# Patient Record
Sex: Male | Born: 1960
Health system: Southern US, Community
[De-identification: ages and names within clinical notes are randomized; demographics above are authoritative.]

## PROBLEM LIST (undated history)

## (undated) DIAGNOSIS — M1990 Unspecified osteoarthritis, unspecified site: Secondary | ICD-10-CM

## (undated) DIAGNOSIS — M797 Fibromyalgia: Secondary | ICD-10-CM

## (undated) HISTORY — PX: FINGER SURGERY: SHX640

## (undated) HISTORY — PX: EYE SURGERY: SHX253

## (undated) HISTORY — PX: TONSILLECTOMY: SUR1361

---

## 2004-03-02 ENCOUNTER — Encounter: Admission: RE | Admit: 2004-03-02 | Discharge: 2004-03-02 | Payer: Self-pay | Admitting: Family Medicine

## 2009-01-06 ENCOUNTER — Encounter: Admission: RE | Admit: 2009-01-06 | Discharge: 2009-01-06 | Payer: Self-pay | Admitting: Family Medicine

## 2009-05-01 ENCOUNTER — Emergency Department (HOSPITAL_COMMUNITY): Admission: EM | Admit: 2009-05-01 | Discharge: 2009-05-01 | Payer: Self-pay | Admitting: Emergency Medicine

## 2010-06-07 LAB — POCT CARDIAC MARKERS
CKMB, poc: <1 ng/mL — ABNORMAL LOW (ref 1.0–8.0)
Myoglobin, poc: 63.5 ng/mL (ref 12–200)
Troponin i, poc: <0.05 ng/mL (ref 0.00–0.09)

## 2010-06-07 LAB — POCT I-STAT, CHEM 8
BUN: 12 mg/dL (ref 6–23)
Calcium, Ion: 1.23 mmol/L (ref 1.12–1.32)
Chloride: 107 mEq/L (ref 96–112)
Creatinine, Ser: 1 mg/dL (ref 0.4–1.5)
Glucose, Bld: 89 mg/dL (ref 70–99)
HCT: 46 % (ref 39.0–52.0)
Hemoglobin: 15.6 g/dL (ref 13.0–17.0)
Potassium: 4.6 mEq/L (ref 3.5–5.1)
Sodium: 138 mEq/L (ref 135–145)
TCO2: 28 mmol/L (ref 0–100)

## 2010-06-07 LAB — URINALYSIS, ROUTINE W REFLEX MICROSCOPIC
Bilirubin Urine: NEGATIVE
Glucose, UA: NEGATIVE mg/dL
Hgb urine dipstick: NEGATIVE
Ketones, ur: NEGATIVE mg/dL
Nitrite: NEGATIVE
Protein, ur: NEGATIVE mg/dL
Specific Gravity, Urine: 1.013 (ref 1.005–1.030)
Urobilinogen, UA: 0.2 mg/dL (ref 0.0–1.0)
pH: 5 (ref 5.0–8.0)

## 2010-06-07 LAB — RAPID URINE DRUG SCREEN, HOSP PERFORMED
Amphetamines: NOT DETECTED
Barbiturates: NOT DETECTED
Benzodiazepines: NOT DETECTED
Cocaine: NOT DETECTED
Opiates: NOT DETECTED
Tetrahydrocannabinol: NOT DETECTED

## 2010-06-07 LAB — BASIC METABOLIC PANEL
BUN: 10 mg/dL (ref 6–23)
CO2: 27 mEq/L (ref 19–32)
Calcium: 10 mg/dL (ref 8.4–10.5)
Chloride: 103 mEq/L (ref 96–112)
Creatinine, Ser: 0.85 mg/dL (ref 0.4–1.5)
GFR calc Af Amer: >60 mL/min (ref >60)
GFR calc non Af Amer: >60 mL/min (ref >60)
Glucose, Bld: 91 mg/dL (ref 70–99)
Potassium: 4.8 mEq/L (ref 3.5–5.1)
Sodium: 137 mEq/L (ref 135–145)

## 2010-06-07 LAB — ETHANOL: Alcohol, Ethyl (B): <5 mg/dL (ref 0–10)

## 2014-07-21 ENCOUNTER — Emergency Department (HOSPITAL_COMMUNITY): Payer: 59

## 2014-07-21 ENCOUNTER — Emergency Department (HOSPITAL_COMMUNITY)
Admission: EM | Admit: 2014-07-21 | Discharge: 2014-07-21 | Disposition: A | Discharge status: Home / Self Care | Payer: 59 | Attending: Emergency Medicine | Admitting: Emergency Medicine

## 2014-07-21 ENCOUNTER — Encounter (HOSPITAL_COMMUNITY): Payer: Self-pay | Admitting: Emergency Medicine

## 2014-07-21 DIAGNOSIS — Z720 Tobacco use: Secondary | ICD-10-CM | POA: Insufficient documentation

## 2014-07-21 DIAGNOSIS — D219 Benign neoplasm of connective and other soft tissue, unspecified: Secondary | ICD-10-CM | POA: Diagnosis not present

## 2014-07-21 DIAGNOSIS — M25511 Pain in right shoulder: Secondary | ICD-10-CM | POA: Insufficient documentation

## 2014-07-21 DIAGNOSIS — M5412 Radiculopathy, cervical region: Secondary | ICD-10-CM | POA: Diagnosis not present

## 2014-07-21 DIAGNOSIS — M25519 Pain in unspecified shoulder: Secondary | ICD-10-CM

## 2014-07-21 DIAGNOSIS — D492 Neoplasm of unspecified behavior of bone, soft tissue, and skin: Secondary | ICD-10-CM

## 2014-07-21 LAB — BASIC METABOLIC PANEL
Anion gap: 9 (ref 5–15)
BUN: 12 mg/dL (ref 6–20)
CALCIUM: 8.7 mg/dL — AB (ref 8.9–10.3)
CO2: 23 mmol/L (ref 22–32)
Chloride: 108 mmol/L (ref 101–111)
Creatinine, Ser: 0.89 mg/dL (ref 0.61–1.24)
GFR calc Af Amer: >60 mL/min (ref >60)
GFR calc non Af Amer: >60 mL/min (ref >60)
Glucose, Bld: 101 mg/dL — ABNORMAL HIGH (ref 70–99)
Potassium: 4.2 mmol/L (ref 3.5–5.1)
SODIUM: 140 mmol/L (ref 135–145)

## 2014-07-21 LAB — CBC WITH DIFFERENTIAL/PLATELET
BASOS PCT: 0 % (ref 0–1)
Basophils Absolute: 0 10*3/uL (ref 0.0–0.1)
EOS ABS: 0.2 10*3/uL (ref 0.0–0.7)
Eosinophils Relative: 2 % (ref 0–5)
HCT: 42.8 % (ref 39.0–52.0)
Hemoglobin: 14.7 g/dL (ref 13.0–17.0)
Lymphocytes Relative: 17 % (ref 12–46)
Lymphs Abs: 1.6 10*3/uL (ref 0.7–4.0)
MCH: 31.2 pg (ref 26.0–34.0)
MCHC: 34.3 g/dL (ref 30.0–36.0)
MCV: 90.9 fL (ref 78.0–100.0)
Monocytes Absolute: 1 10*3/uL (ref 0.1–1.0)
Monocytes Relative: 11 % (ref 3–12)
Neutro Abs: 6.6 10*3/uL (ref 1.7–7.7)
Neutrophils Relative %: 70 % (ref 43–77)
PLATELETS: 234 10*3/uL (ref 150–400)
RBC: 4.71 MIL/uL (ref 4.22–5.81)
RDW: 13.1 % (ref 11.5–15.5)
WBC: 9.3 10*3/uL (ref 4.0–10.5)

## 2014-07-21 LAB — I-STAT TROPONIN, ED: Troponin i, poc: 0.01 ng/mL (ref 0.00–0.08)

## 2014-07-21 NOTE — ED Notes (Signed)
Patient with left shoulder pain.  Patient states that he woke up with a knot on left shoulder.  He states that it was red, but now with increased swelling in the lump.  Now with pain to touch at the left shoulder.  Patient is also having numbness and tingling in bilateral hands and feet that started last night.

## 2014-07-21 NOTE — Discharge Instructions (Signed)
Lipoma A lipoma is a noncancerous (benign) tumor composed of fat cells. They are usually found under the skin (subcutaneous). A lipoma may occur in any tissue of the body that contains fat. Common areas for lipomas to appear include the back, shoulders, buttocks, and thighs. Lipomas are a very common soft tissue growth. They are soft and grow slowly. Most problems caused by a lipoma depend on where it is growing. DIAGNOSIS  A lipoma can be diagnosed with a physical exam. These tumors rarely become cancerous, but radiographic studies can help determine this for certain. Studies used may include:  Computerized X-ray scans (CT or CAT scan).  Computerized magnetic scans (MRI). TREATMENT  Small lipomas that are not causing problems may be watched. If a lipoma continues to enlarge or causes problems, removal is often the best treatment. Lipomas can also be removed to improve appearance. Surgery is done to remove the fatty cells and the surrounding capsule. Most often, this is done with medicine that numbs the area (local anesthetic). The removed tissue is examined under a microscope to make sure it is not cancerous. Keep all follow-up appointments with your caregiver. SEEK MEDICAL CARE IF:   The lipoma becomes larger or hard.  The lipoma becomes painful, red, or increasingly swollen. These could be signs of infection or a more serious condition. Document Released: 02/22/2002 Document Revised: 05/27/2011 Document Reviewed: 08/04/2009 [facility] Patient Information 2015 [place], [place]. This information is not intended to replace advice given to you by your health care provider. Make sure you discuss any questions you have with your health care provider.  Cervical Radiculopathy Cervical radiculopathy happens when a nerve in the neck is pinched or bruised by a slipped (herniated) disk or by arthritic changes in the bones of the cervical spine. This can occur due to an injury or as part of the normal aging  process. Pressure on the cervical nerves can cause pain or numbness that runs from your neck all the way down into your arm and fingers. CAUSES  There are many possible causes, including:  Injury.  Muscle tightness in the neck from overuse.  Swollen, painful joints (arthritis).  Breakdown or degeneration in the bones and joints of the spine (spondylosis) due to aging.  Bone spurs that may develop near the cervical nerves. SYMPTOMS  Symptoms include pain, weakness, or numbness in the affected arm and hand. Pain can be severe or irritating. Symptoms may be worse when extending or turning the neck. DIAGNOSIS  Your caregiver will ask about your symptoms and do a physical exam. He or she may test your strength and reflexes. X-rays, CT scans, and MRI scans may be needed in cases of injury or if the symptoms do not go away after a period of time. Electromyography (EMG) or nerve conduction testing may be done to study how your nerves and muscles are working. TREATMENT  Your caregiver may recommend certain exercises to help relieve your symptoms. Cervical radiculopathy can, and often does, get better with time and treatment. If your problems continue, treatment options may include:  Wearing a soft collar for short periods of time.  Physical therapy to strengthen the neck muscles.  Medicines, such as nonsteroidal anti-inflammatory drugs (NSAIDs), oral corticosteroids, or spinal injections.  Surgery. Different types of surgery may be done depending on the cause of your problems. HOME CARE INSTRUCTIONS   Put ice on the affected area.  Put ice in a plastic bag.  Place a towel between your skin and the bag.  Leave  the ice on for 15-20 minutes, 03-04 times a day or as directed by your caregiver.  If ice does not help, you can try using heat. Take a warm shower or bath, or use a hot water bottle as directed by your caregiver.  You may try a gentle neck and shoulder massage.  Use a flat  pillow when you sleep.  Only take over-the-counter or prescription medicines for pain, discomfort, or fever as directed by your caregiver.  If physical therapy was prescribed, follow your caregiver's directions.  If a soft collar was prescribed, use it as directed. SEEK IMMEDIATE MEDICAL CARE IF:   Your pain gets much worse and cannot be controlled with medicines.  You have weakness or numbness in your hand, arm, face, or leg.  You have a high fever or a stiff, rigid neck.  You lose bowel or bladder control (incontinence).  You have trouble with walking, balance, or speaking. MAKE SURE YOU:   Understand these instructions.  Will watch your condition.  Will get help right away if you are not doing well or get worse. Document Released: 11/27/2000 Document Revised: 05/27/2011 Document Reviewed: 10/16/2010 [facility] Patient Information 2015 [place], [place]. This information is not intended to replace advice given to you by your health care provider. Make sure you discuss any questions you have with your health care provider.

## 2014-07-21 NOTE — ED Provider Notes (Signed)
CSN: [id number]     Arrival date & time 07/21/14  2130 History   First MD Initiated Contact with Patient 07/21/14 [phone]     Chief Complaint  Patient presents with  . Shoulder Pain     (Consider location/radiation/quality/duration/timing/severity/associated sxs/prior Treatment) HPI Dylan Figueroa is a 54 year old male with no significant past medical history who presents the ER with complaint of a knot on the shoulder, and an episode of lightheadedness on his way to work. Patient states morning he woke up and noticed pain in his right shoulder. He noticed a "knot" on the superior aspect of his acromial region. Patient states while he was driving to work he had an episode of she describes headedness where he began to have tingling sensation bilaterally in his hands and his. Patient states by the time he came to the ER the lightheadedness, and tingling sensation had subsided. Patient denies shoulder injury, swelling, warmth. Patient denies headache, blurred vision, dizziness, weakness, chest pain, shortness of breath, palpitations, nausea, vomiting, abdominal pain, fever.  History reviewed. No pertinent past medical history. Past Surgical History  Procedure Laterality Date  . Finger surgery     History reviewed. No pertinent family history. History  Substance Use Topics  . Smoking status: Current Every Day Smoker  . Smokeless tobacco: Not on file  . Alcohol Use: 4.2 oz/week    7 Cans of beer per week     Comment: 1 can per day    Review of Systems  Constitutional: Negative for fever.  HENT: Negative for trouble swallowing.   Eyes: Negative for visual disturbance.  Respiratory: Negative for shortness of breath.   Cardiovascular: Negative for chest pain.  Gastrointestinal: Negative for nausea, vomiting and abdominal pain.  Genitourinary: Negative for dysuria.  Musculoskeletal: Negative for neck pain.       Mass on shoulder  Skin: Negative for rash.  Neurological: Negative for dizziness,  weakness and numbness.  Psychiatric/Behavioral: Negative.       Allergies  Review of patient's allergies indicates no known allergies.  Home Medications   Prior to Admission medications   Not on File   BP 111/64 mmHg  Pulse 68  Temp(Src) 98 F (36.7 C) (Oral)  Resp 23  Ht 5\' 8"  (1.727 m)  Wt 170 lb (77.111 kg)  BMI 25.85 kg/m2  SpO2 96% Physical Exam  Constitutional: He is oriented to person, place, and time. He appears well-developed and well-nourished. No distress.  HENT:  Head: Normocephalic and atraumatic.  Mouth/Throat: Oropharynx is clear and moist. No oropharyngeal exudate.  Eyes: Right eye exhibits no discharge. Left eye exhibits no discharge. No scleral icterus.  Neck: Normal range of motion.  Cardiovascular: Normal rate, regular rhythm and normal heart sounds.   No murmur heard. Pulmonary/Chest: Effort normal and breath sounds normal. No respiratory distress.  Abdominal: Soft. There is no tenderness.  Musculoskeletal: Normal range of motion. He exhibits no edema or tenderness.  Neurological: He is alert and oriented to person, place, and time. No cranial nerve deficit. Coordination normal.  Skin: Skin is warm and dry. No rash noted. He is not diaphoretic.  Psychiatric: He has a normal mood and affect.  Nursing note and vitals reviewed.   ED Course  Procedures (including critical care time) Labs Review Labs Reviewed  BASIC METABOLIC PANEL - Abnormal; Notable for the following:    Glucose, Bld 101 (*)    Calcium 8.7 (*)    All other components within normal limits  CBC WITH DIFFERENTIAL/PLATELET  I-STAT  TROPOININ, ED    Imaging Review Dg Shoulder Left  07/21/2014   CLINICAL DATA:  54 year old male with left shoulder pain for several weeks with no known injury. Painful palpable abnormality along the superior aspect of the shoulder discovered this morning. Initial encounter.  EXAM: LEFT SHOULDER - 2+ VIEW  COMPARISON:  Chest radiographs from [facility] 03/20/2012  FINDINGS: No glenohumeral joint dislocation. Proximal left humerus intact. Small calcified density near the rotator cuff insertion likely is degenerative. No left clavicle or scapula fracture identified. Visible left ribs and lung parenchyma within normal limits. No discrete soft tissue lesion is evident.  IMPRESSION: No acute osseous abnormality identified about the left shoulder.   Electronically Signed   By: Genevie Ann M.D.   On: 07/21/2014 08:11     EKG Interpretation   Date/Time:  Thursday Jul 21 2014 08:11:39 EDT Ventricular Rate:  57 PR Interval:  150 QRS Duration: 88 QT Interval:  400 QTC Calculation: 389 R Axis:   37 Text Interpretation:  Sinus bradycardia Otherwise normal ECG No  significant change since last tracing Confirmed by Debby Freiberg [phone])  on 07/21/2014 9:40:05 AM      MDM   Final diagnoses:  Shoulder pain  Soft tissue tumor  Cervical radiculopathy    Patient here with several complaints, patient was initially concerned about an episode of lightheadedness and bilateral extremity tingling sensation. Patient states he is having the sensation of tingling in his arms and a dermatome pattern with over the past several weeks which he states has been mostly associated with movement of his arms and specific directions. Patient does not have neck pain and has full range of motion of neck. No neck injuries. Episode of lightheadedness today seems to be benign. There is no concern for ACS. Wells criteria for PE 0. There is no concern for CVA. Neuro exam is benign, and patient has normal strength in all extremities. Patient has been asymptomatic throughout ER stay here. With tingling sensation being completely positional, likely patient may be having some mild cervical radiculopathy. Mass on patient's shoulder most consistent with a lipoma or epidermoid cyst. Ultrasound was placed, does not show any pockets of fluid collection. There is no concern for  infection of this. Patient afebrile, hemodynamically stable, well-appearing and in no acute distress throughout stay here. He is also asymptomatic throughout his stay here. Patient will be discharged and strongly encouraged to follow-up with his primary care provider. Discussed return precautions with patient, patient verbalizes understanding and agreement of this plan. Encouraged patient to call or return to ER if any worsening symptoms or should he have any questions or concerns.  BP 111/64 mmHg  Pulse 68  Temp(Src) 98 F (36.7 C) (Oral)  Resp 23  Ht 5\' 8"  (1.727 m)  Wt 170 lb (77.111 kg)  BMI 25.85 kg/m2  SpO2 96%  Signed,  Dahlia Bailiff, PA-C 5:51 PM   Dahlia Bailiff, PA-C 07/21/14 1752  Debby Freiberg, MD 07/24/14 [phone]

## 2014-09-30 ENCOUNTER — Ambulatory Visit: Payer: Self-pay | Admitting: Surgery

## 2014-09-30 NOTE — H&P (Signed)
History of Present Illness Dylan Figueroa. Tiron Suski MD; 09/30/2014 2:24 PM) Patient words: lipoma on upper back.  The patient is a 54 year old male who presents with a complaint of Mass. Referred by Dr. Gaynelle Figueroa. This is a 54 year old male who presents with recent onset of a palpable mass on his left shoulder. The patient states that he woke up one morning and noticed a tender mass on the upper left shoulder. He presented to the emergency department then they felt that this represented a lipoma. It is a bit unusual because the patient states that he has never seen this before and it is fairly obvious. Initially this area was fairly erythematous and tender. The erythema has faded but is still remains mildly tender. He has not had any imaging of this area. Other Problems Dylan Figueroa, CMA; 09/30/2014 9:48 AM) No pertinent past medical history  Past Surgical History Dylan Figueroa, CMA; 09/30/2014 9:48 AM) No pertinent past surgical history  Diagnostic Studies History Dylan Figueroa, CMA; 09/30/2014 9:48 AM) Colonoscopy 1-5 years ago  Allergies Dylan Figueroa, CMA; 09/30/2014 9:48 AM) No Known Drug Allergies 09/30/2014  Medication History Dylan Figueroa, CMA; 09/30/2014 9:49 AM) Lovastatin (40MG  Tablet, Oral) Active. Venlafaxine HCl (75MG  Capsule ER, Oral) Active. TraMADol HCl (50MG  Tablet, Oral) Active. Medications Reconciled  Social History Dylan Figueroa, [place]; 09/30/2014 9:48 AM) Alcohol use Moderate alcohol use. Caffeine use Coffee. No drug use Tobacco use Current every day smoker.  Family History Dylan Figueroa, [place]; 09/30/2014 9:48 AM) Breast Cancer Mother. Cancer Father.     Review of Systems Dylan Figueroa CMA; 09/30/2014 9:48 AM) General Present- Fatigue and Night Sweats. Not Present- Appetite Loss, Chills, Fever, Weight Gain and Weight Loss. Skin Not Present- Change in Wart/Mole, Dryness, Hives, Jaundice, New Lesions, Non-Healing Wounds, Rash and Ulcer. HEENT Present-  Hearing Loss and Wears glasses/contact lenses. Not Present- Earache, Hoarseness, Nose Bleed, Oral Ulcers, Ringing in the Ears, Seasonal Allergies, Sinus Pain, Sore Throat, Visual Disturbances and Yellow Eyes. Breast Not Present- Breast Mass, Breast Pain, Nipple Discharge and Skin Changes. Cardiovascular Not Present- Chest Pain, Difficulty Breathing Lying Down, Leg Cramps, Palpitations, Rapid Heart Rate, Shortness of Breath and Swelling of Extremities. Gastrointestinal Not Present- Abdominal Pain, Bloating, Bloody Stool, Change in Bowel Habits, Chronic diarrhea, Constipation, Difficulty Swallowing, Excessive gas, Gets full quickly at meals, Hemorrhoids, Indigestion, Nausea, Rectal Pain and Vomiting. Male Genitourinary Not Present- Blood in Urine, Change in Urinary Stream, Frequency, Impotence, Nocturia, Painful Urination, Urgency and Urine Leakage. Musculoskeletal Present- Joint Pain. Not Present- Back Pain, Joint Stiffness, Muscle Pain, Muscle Weakness and Swelling of Extremities. Neurological Not Present- Decreased Memory, Fainting, Headaches, Numbness, Seizures, Tingling, Tremor, Trouble walking and Weakness. Psychiatric Not Present- Anxiety, Bipolar, Change in Sleep Pattern, Depression, Fearful and Frequent crying. Endocrine Not Present- Cold Intolerance, Excessive Hunger, Hair Changes, Heat Intolerance, Hot flashes and New Diabetes. Hematology Not Present- Easy Bruising, Excessive bleeding, Gland problems, HIV and Persistent Infections.  Vitals Dylan Figueroa CMA; 09/30/2014 9:49 AM) 09/30/2014 9:49 AM Weight: 171 lb Height: 68in Body Surface Area: 1.93 m Body Mass Index: 26 kg/m Temp.: 98.39F(Oral)  Pulse: 89 (Regular)  BP: 130/72 (Sitting, Left Arm, Standard)     Physical Exam [name] K. Malaka Ruffner MD; 09/30/2014 2:25 PM)  The physical exam findings are as follows: Note:WDWN in NAD HEENT: EOMI, sclera anicteric Neck: No masses, no thyromegaly Left shoulder - 3 cm subcutaneous  mass; soft; well-demarcated; no overlying erythema or induration Lungs: CTA bilaterally; normal respiratory effort CV: Regular rate and rhythm; no murmurs  Abd: +bowel sounds, soft, non-tender, no masses Ext: Well-perfused; no edema Skin: Warm, dry; no sign of jaundice    Assessment & Plan [name] K. Tahjir Silveria MD; 09/30/2014 10:58 AM)  MASS OF SKIN OF LEFT SHOULDER (782.2  R22.32)  Current Plans Schedule for Surgery - Excision of left shoulder mass - 3 cm. The surgical procedure has been discussed with the patient. Potential risks, benefits, alternative treatments, and expected outcomes have been explained. All of the patient's questions at this time have been answered. The likelihood of reaching the patient's treatment goal is good. The patient understand the proposed surgical procedure and wishes to proceed.  Dylan Figueroa. [name], MD, [facility]  General/ Trauma Surgery  09/30/2014 2:25 PM

## 2014-10-11 NOTE — Patient Instructions (Addendum)
Dylan Figueroa  10/11/2014   Your procedure is scheduled on: Thursday 10/13/2014  Report to [facility] Main  Entrance take [place]  elevators to 3rd floor to  [place] at  [place]  AM.  Call this number if you have problems the morning of surgery [phone]   Remember: ONLY 1 PERSON MAY GO WITH YOU TO SHORT STAY TO GET  READY MORNING OF [name].  Do not eat food or drink liquids :After Midnight.     Take these medicines the morning of surgery with A SIP OF WATER: none                               You may not have any metal on your body including hair pins and              piercings  Do not wear jewelry, make-up, lotions, powders or perfumes, deodorant             Do not wear nail polish.  Do not shave  48 hours prior to surgery.              Men may shave face and neck.   Do not bring valuables to the hospital. [place].  Contacts, dentures or bridgework may not be worn into surgery.  Leave suitcase in the car. After surgery it may be brought to your room.     Patients discharged the day of surgery will not be allowed to drive home.  Name and phone number of your driver:  Special Instructions: N/A              Please read over the following fact sheets you were given: _____________________________________________________________________             [facility] - Preparing for Surgery Before surgery, you can play an important role.  Because skin is not sterile, your skin needs to be as free of germs as possible.  You can reduce the number of germs on your skin by washing with CHG (chlorahexidine gluconate) soap before surgery.  CHG is an antiseptic cleaner which kills germs and bonds with the skin to continue killing germs even after washing. Please DO NOT use if you have an allergy to CHG or antibacterial soaps.  If your skin becomes reddened/irritated stop using the CHG and inform your nurse when  you arrive at Short Stay. Do not shave (including legs and underarms) for at least 48 hours prior to the first CHG shower.  You may shave your face/neck. Please follow these instructions carefully:  1.  Shower with CHG Soap the night before surgery and the  morning of Surgery.  2.  If you choose to wash your hair, wash your hair first as usual with your  normal  shampoo.  3.  After you shampoo, rinse your hair and body thoroughly to remove the  shampoo.                           4.  Use CHG as you would any other liquid soap.  You can apply chg directly  to the skin and wash  Gently with a scrungie or clean washcloth.  5.  Apply the CHG Soap to your body ONLY FROM THE NECK DOWN.   Do not use on face/ open                           Wound or open sores. Avoid contact with eyes, ears mouth and genitals (private parts).                       Wash face,  Genitals (private parts) with your normal soap.             6.  Wash thoroughly, paying special attention to the area where your surgery  will be performed.  7.  Thoroughly rinse your body with warm water from the neck down.  8.  DO NOT shower/wash with your normal soap after using and rinsing off  the CHG Soap.                9.  Pat yourself dry with a clean towel.            10.  Wear clean pajamas.            11.  Place clean sheets on your bed the night of your first shower and do not  sleep with pets. Day of Surgery : Do not apply any lotions/deodorants the morning of surgery.  Please wear clean clothes to the hospital/surgery center.  FAILURE TO FOLLOW THESE INSTRUCTIONS MAY RESULT IN THE CANCELLATION OF YOUR SURGERY PATIENT SIGNATURE_________________________________  NURSE SIGNATURE__________________________________  ________________________________________________________________________   Adam [place]ix  An incentive spirometer is a tool that can help keep your lungs clear and active. This tool measures  how well you are filling your lungs with each breath. Taking long deep breaths may help reverse or decrease the chance of developing breathing (pulmonary) problems (especially infection) following:  A long period of time when you are unable to move or be active. BEFORE THE PROCEDURE   If the spirometer includes an indicator to show your best effort, your nurse or respiratory therapist will set it to a desired goal.  If possible, sit up straight or lean slightly forward. Try not to slouch.  Hold the incentive spirometer in an upright position. INSTRUCTIONS FOR USE  1. Sit on the edge of your bed if possible, or sit up as far as you can in bed or on a chair. 2. Hold the incentive spirometer in an upright position. 3. Breathe out normally. 4. Place the mouthpiece in your mouth and seal your lips tightly around it. 5. Breathe in slowly and as deeply as possible, raising the piston or the ball toward the top of the column. 6. Hold your breath for 3-5 seconds or for as long as possible. Allow the piston or ball to fall to the bottom of the column. 7. Remove the mouthpiece from your mouth and breathe out normally. 8. Rest for a few seconds and repeat Steps 1 through 7 at least 10 times every 1-2 hours when you are awake. Take your time and take a few normal breaths between deep breaths. 9. The spirometer may include an indicator to show your best effort. Use the indicator as a goal to work toward during each repetition. 10. After each set of 10 deep breaths, practice coughing to be sure your lungs are clear. If you have an incision (the cut made at the time of surgery),  support your incision when coughing by placing a pillow or rolled up towels firmly against it. Once you are able to get out of bed, walk around indoors and cough well. You may stop using the incentive spirometer when instructed by your caregiver.  RISKS AND COMPLICATIONS  Take your time so you do not get dizzy or light-headed.  If  you are in pain, you may need to take or ask for pain medication before doing incentive spirometry. It is harder to take a deep breath if you are having pain. AFTER USE  Rest and breathe slowly and easily.  It can be helpful to keep track of a log of your progress. Your caregiver can provide you with a simple table to help with this. If you are using the spirometer at home, follow these instructions: [place] IF:   You are having difficultly using the spirometer.  You have trouble using the spirometer as often as instructed.  Your pain medication is not giving enough relief while using the spirometer.  You develop fever of 100.5 F (38.1 C) or higher. SEEK IMMEDIATE MEDICAL CARE IF:   You cough up bloody sputum that had not been present before.  You develop fever of 102 F (38.9 C) or greater.  You develop worsening pain at or near the incision site. MAKE SURE YOU:   Understand these instructions.  Will watch your condition.  Will get help right away if you are not doing well or get worse. Document Released: 07/15/2006 Document Revised: 05/27/2011 Document Reviewed: 09/15/2006 [facility] Patient Information 2014 [place], [place].   ________________________________________________________________________

## 2014-10-12 ENCOUNTER — Encounter (HOSPITAL_COMMUNITY): Payer: Self-pay

## 2014-10-12 ENCOUNTER — Encounter (HOSPITAL_COMMUNITY)
Admission: RE | Admit: 2014-10-12 | Discharge: 2014-10-12 | Disposition: A | Discharge status: Home / Self Care | Payer: 59 | Source: Ambulatory Visit | Attending: Surgery | Admitting: Surgery

## 2014-10-12 DIAGNOSIS — F17200 Nicotine dependence, unspecified, uncomplicated: Secondary | ICD-10-CM | POA: Diagnosis not present

## 2014-10-12 DIAGNOSIS — Z79899 Other long term (current) drug therapy: Secondary | ICD-10-CM | POA: Diagnosis not present

## 2014-10-12 DIAGNOSIS — R2232 Localized swelling, mass and lump, left upper limb: Secondary | ICD-10-CM | POA: Diagnosis present

## 2014-10-12 DIAGNOSIS — E65 Localized adiposity: Secondary | ICD-10-CM | POA: Diagnosis not present

## 2014-10-12 HISTORY — DX: Unspecified osteoarthritis, unspecified site: M19.90

## 2014-10-12 HISTORY — DX: Fibromyalgia: M79.7

## 2014-10-12 LAB — CBC
HCT: 41.8 % (ref 39.0–52.0)
Hemoglobin: 14.1 g/dL (ref 13.0–17.0)
MCH: 30.9 pg (ref 26.0–34.0)
MCHC: 33.7 g/dL (ref 30.0–36.0)
MCV: 91.7 fL (ref 78.0–100.0)
Platelets: 256 10*3/uL (ref 150–400)
RBC: 4.56 MIL/uL (ref 4.22–5.81)
RDW: 13.1 % (ref 11.5–15.5)
WBC: 13.4 10*3/uL — ABNORMAL HIGH (ref 4.0–10.5)

## 2014-10-13 ENCOUNTER — Encounter (HOSPITAL_COMMUNITY)
Admission: RE | Disposition: A | Discharge status: Home / Self Care | Payer: Self-pay | Source: Ambulatory Visit | Attending: Surgery

## 2014-10-13 ENCOUNTER — Encounter (HOSPITAL_COMMUNITY): Payer: Self-pay | Admitting: *Deleted

## 2014-10-13 ENCOUNTER — Ambulatory Visit (HOSPITAL_COMMUNITY): Payer: 59 | Admitting: Anesthesiology

## 2014-10-13 ENCOUNTER — Ambulatory Visit (HOSPITAL_COMMUNITY)
Admission: RE | Admit: 2014-10-13 | Discharge: 2014-10-13 | Disposition: A | Discharge status: Home / Self Care | Payer: 59 | Source: Ambulatory Visit | Attending: Surgery | Admitting: Surgery

## 2014-10-13 DIAGNOSIS — E65 Localized adiposity: Secondary | ICD-10-CM | POA: Insufficient documentation

## 2014-10-13 DIAGNOSIS — F17200 Nicotine dependence, unspecified, uncomplicated: Secondary | ICD-10-CM | POA: Insufficient documentation

## 2014-10-13 DIAGNOSIS — Z79899 Other long term (current) drug therapy: Secondary | ICD-10-CM | POA: Insufficient documentation

## 2014-10-13 HISTORY — PX: MASS EXCISION: SHX2000

## 2014-10-13 SURGERY — EXCISION MASS
Anesthesia: General | Laterality: Left

## 2014-10-13 MED ORDER — 0.9 % SODIUM CHLORIDE (POUR BTL) OPTIME
TOPICAL | Status: DC | PRN
  Administered 2014-10-13: 1000 mL

## 2014-10-13 MED ORDER — MEPERIDINE HCL 50 MG/ML IJ SOLN: 6.2500 mg | INTRAMUSCULAR | Status: DC | PRN

## 2014-10-13 MED ORDER — FENTANYL CITRATE (PF) 100 MCG/2ML IJ SOLN
INTRAMUSCULAR | Status: AC
  Filled 2014-10-13: qty 2

## 2014-10-13 MED ORDER — DEXAMETHASONE SODIUM PHOSPHATE 10 MG/ML IJ SOLN
INTRAMUSCULAR | Status: AC
  Filled 2014-10-13: qty 1

## 2014-10-13 MED ORDER — BUPIVACAINE-EPINEPHRINE (PF) 0.25% -1:200000 IJ SOLN
INTRAMUSCULAR | Status: AC
  Filled 2014-10-13: qty 30

## 2014-10-13 MED ORDER — LACTATED RINGERS IV SOLN
INTRAVENOUS | Status: DC | PRN
  Administered 2014-10-13: 07:00:00 via INTRAVENOUS

## 2014-10-13 MED ORDER — ONDANSETRON HCL 4 MG/2ML IJ SOLN: 4.0000 mg | INTRAMUSCULAR | Status: DC | PRN

## 2014-10-13 MED ORDER — PROPOFOL 10 MG/ML IV BOLUS
INTRAVENOUS | Status: AC
  Filled 2014-10-13: qty 20

## 2014-10-13 MED ORDER — LACTATED RINGERS IV SOLN: INTRAVENOUS | Status: DC

## 2014-10-13 MED ORDER — BUPIVACAINE HCL (PF) 0.25 % IJ SOLN
INTRAMUSCULAR | Status: DC | PRN
  Administered 2014-10-13: 15 mL

## 2014-10-13 MED ORDER — MORPHINE SULFATE 10 MG/ML IJ SOLN: 2.0000 mg | INTRAMUSCULAR | Status: DC | PRN

## 2014-10-13 MED ORDER — CEFAZOLIN SODIUM-DEXTROSE 2-3 GM-% IV SOLR
INTRAVENOUS | Status: AC
  Filled 2014-10-13: qty 50

## 2014-10-13 MED ORDER — HYDROCODONE-ACETAMINOPHEN 5-325 MG PO TABS
1.0000 | ORAL_TABLET | ORAL | Status: DC | PRN
  Administered 2014-10-13: 1 via ORAL
  Filled 2014-10-13: qty 1

## 2014-10-13 MED ORDER — FENTANYL CITRATE (PF) 250 MCG/5ML IJ SOLN
INTRAMUSCULAR | Status: DC | PRN
  Administered 2014-10-13 (×5): 25 ug via INTRAVENOUS

## 2014-10-13 MED ORDER — HYDROCODONE-ACETAMINOPHEN 5-325 MG PO TABS: 1.0000 | ORAL_TABLET | ORAL | Status: AC | PRN

## 2014-10-13 MED ORDER — PROPOFOL 10 MG/ML IV BOLUS
INTRAVENOUS | Status: DC | PRN
  Administered 2014-10-13: 200 mg via INTRAVENOUS

## 2014-10-13 MED ORDER — MIDAZOLAM HCL 5 MG/5ML IJ SOLN
INTRAMUSCULAR | Status: DC | PRN
  Administered 2014-10-13: 2 mg via INTRAVENOUS

## 2014-10-13 MED ORDER — ONDANSETRON HCL 4 MG/2ML IJ SOLN: 4.0000 mg | Freq: Once | INTRAMUSCULAR | Status: DC | PRN

## 2014-10-13 MED ORDER — ONDANSETRON HCL 4 MG/2ML IJ SOLN
INTRAMUSCULAR | Status: DC | PRN
  Administered 2014-10-13: 4 mg via INTRAVENOUS

## 2014-10-13 MED ORDER — MIDAZOLAM HCL 2 MG/2ML IJ SOLN
INTRAMUSCULAR | Status: AC
  Filled 2014-10-13: qty 2

## 2014-10-13 MED ORDER — FENTANYL CITRATE (PF) 100 MCG/2ML IJ SOLN
INTRAMUSCULAR | Status: AC
Start: 2014-10-13 — End: 2014-10-13
  Filled 2014-10-13: qty 2

## 2014-10-13 MED ORDER — CEFAZOLIN SODIUM-DEXTROSE 2-3 GM-% IV SOLR
2.0000 g | INTRAVENOUS | Status: AC
  Administered 2014-10-13: 2 g via INTRAVENOUS

## 2014-10-13 MED ORDER — CHLORHEXIDINE GLUCONATE 4 % EX LIQD: 1.0000 "application " | Freq: Once | CUTANEOUS | Status: DC

## 2014-10-13 MED ORDER — ONDANSETRON HCL 4 MG/2ML IJ SOLN
INTRAMUSCULAR | Status: AC
  Filled 2014-10-13: qty 2

## 2014-10-13 MED ORDER — DEXAMETHASONE SODIUM PHOSPHATE 10 MG/ML IJ SOLN
INTRAMUSCULAR | Status: DC | PRN
  Administered 2014-10-13: 10 mg via INTRAVENOUS

## 2014-10-13 MED ORDER — HYDROMORPHONE HCL 1 MG/ML IJ SOLN: 0.2500 mg | INTRAMUSCULAR | Status: DC | PRN

## 2014-10-13 SURGICAL SUPPLY — 38 items
APL SKNCLS STERI-STRIP NONHPOA (GAUZE/BANDAGES/DRESSINGS) ×1
BENZOIN TINCTURE PRP APPL 2/3 (GAUZE/BANDAGES/DRESSINGS) ×3 IMPLANT
BLADE HEX COATED 2.75 (ELECTRODE) ×3 IMPLANT
BLADE SURG 15 STRL LF DISP TIS (BLADE) ×1 IMPLANT
BLADE SURG 15 STRL SS (BLADE) ×3
CANISTER SUCTION 1200CC (MISCELLANEOUS) IMPLANT
CHLORAPREP W/TINT 26ML (MISCELLANEOUS) ×3 IMPLANT
CLOSURE WOUND 1/2 X4 (GAUZE/BANDAGES/DRESSINGS) ×1
COVER SURGICAL LIGHT HANDLE (MISCELLANEOUS) IMPLANT
DECANTER SPIKE VIAL GLASS SM (MISCELLANEOUS) IMPLANT
DRAPE UTILITY XL STRL (DRAPES) ×3 IMPLANT
DRSG TEGADERM 4X4.75 (GAUZE/BANDAGES/DRESSINGS) ×3 IMPLANT
ELECT COATED BLADE 2.86 ST (ELECTRODE) IMPLANT
ELECT REM PT RETURN 9FT ADLT (ELECTROSURGICAL) ×3
ELECTRODE REM PT RTRN 9FT ADLT (ELECTROSURGICAL) ×1 IMPLANT
GAUZE PACKING IODOFORM 1/4X15 (GAUZE/BANDAGES/DRESSINGS) IMPLANT
GAUZE SPONGE 2X2 8PLY STRL LF (GAUZE/BANDAGES/DRESSINGS) ×1 IMPLANT
GAUZE SPONGE 4X4 12PLY STRL (GAUZE/BANDAGES/DRESSINGS) IMPLANT
GLOVE BIO SURGEON STRL SZ7 (GLOVE) ×3 IMPLANT
GLOVE BIOGEL PI IND STRL 7.5 (GLOVE) ×1 IMPLANT
GLOVE BIOGEL PI INDICATOR 7.5 (GLOVE) ×2
GOWN STRL REUS W/TWL LRG LVL3 (GOWN DISPOSABLE) ×3 IMPLANT
GOWN STRL REUS W/TWL XL LVL3 (GOWN DISPOSABLE) ×3 IMPLANT
KIT MANIFOLD (MISCELLANEOUS) ×3 IMPLANT
LIQUID BAND (GAUZE/BANDAGES/DRESSINGS) IMPLANT
NEEDLE HYPO 25X1 1.5 SAFETY (NEEDLE) ×3 IMPLANT
NS IRRIG 1000ML POUR BTL (IV SOLUTION) IMPLANT
PACK GENERAL/GYN (CUSTOM PROCEDURE TRAY) ×3 IMPLANT
SPONGE GAUZE 2X2 STER 10/PKG (GAUZE/BANDAGES/DRESSINGS) ×2
STRIP CLOSURE SKIN 1/2X4 (GAUZE/BANDAGES/DRESSINGS) ×2 IMPLANT
SUT ETHILON 4 0 PS 2 18 (SUTURE) IMPLANT
SUT MNCRL AB 4-0 PS2 18 (SUTURE) ×3 IMPLANT
SUT SILK 2 0 SH (SUTURE) IMPLANT
SUT VIC AB 3-0 SH 27 (SUTURE) ×3
SUT VIC AB 3-0 SH 27XBRD (SUTURE) ×1 IMPLANT
SUT VIC AB 4-0 SH 18 (SUTURE) IMPLANT
SYR CONTROL 10ML LL (SYRINGE) ×3 IMPLANT
TOWEL OR 17X26 10 PK STRL BLUE (TOWEL DISPOSABLE) ×3 IMPLANT

## 2014-10-13 NOTE — Discharge Instructions (Signed)
Remove outer dressing in 48 hours, then you may shower over the steristrips. The strips will fall off in 1-2 weeks. Pain medicine as needed Ice pack as needed. No limits on activity

## 2014-10-13 NOTE — Anesthesia Procedure Notes (Signed)
Procedure Name: LMA Insertion Date/Time: 10/13/2014 7:34 AM Performed by: Dione Booze Pre-anesthesia Checklist: Patient identified, Emergency Drugs available, Suction available and Patient being monitored Patient Re-evaluated:Patient Re-evaluated prior to inductionOxygen Delivery Method: Circle system utilized Preoxygenation: Pre-oxygenation with 100% oxygen Intubation Type: IV induction LMA: LMA inserted LMA Size: 4.0 Number of attempts: 1 Placement Confirmation: positive ETCO2 Tube secured with: Tape

## 2014-10-13 NOTE — H&P (View-Only) (Signed)
History of Present Illness Dylan Figueroa. Dylan Blaize MD; 09/30/2014 2:24 PM) Patient words: lipoma on upper back.  The patient is a 54 year old male who presents with a complaint of Mass. Referred by Dr. Gaynelle Arabian. This is a 54 year old male who presents with recent onset of a palpable mass on his left shoulder. The patient states that he woke up one morning and noticed a tender mass on the upper left shoulder. He presented to the emergency department then they felt that this represented a lipoma. It is a bit unusual because the patient states that he has never seen this before and it is fairly obvious. Initially this area was fairly erythematous and tender. The erythema has faded but is still remains mildly tender. He has not had any imaging of this area. Other Problems Dylan Figueroa, CMA; 09/30/2014 9:48 AM) No pertinent past medical history  Past Surgical History Dylan Figueroa, CMA; 09/30/2014 9:48 AM) No pertinent past surgical history  Diagnostic Studies History Dylan Figueroa, CMA; 09/30/2014 9:48 AM) Colonoscopy 1-5 years ago  Allergies Dylan Figueroa, CMA; 09/30/2014 9:48 AM) No Known Drug Allergies 09/30/2014  Medication History Dylan Figueroa, CMA; 09/30/2014 9:49 AM) Lovastatin (40MG  Tablet, Oral) Active. Venlafaxine HCl (75MG  Capsule ER, Oral) Active. TraMADol HCl (50MG  Tablet, Oral) Active. Medications Reconciled  Social History Dylan Figueroa, [place]; 09/30/2014 9:48 AM) Alcohol use Moderate alcohol use. Caffeine use Coffee. No drug use Tobacco use Current every day smoker.  Family History Dylan Figueroa, [place]; 09/30/2014 9:48 AM) Breast Cancer Mother. Cancer Father.     Review of Systems Dylan Figueroa CMA; 09/30/2014 9:48 AM) General Present- Fatigue and Night Sweats. Not Present- Appetite Loss, Chills, Fever, Weight Gain and Weight Loss. Skin Not Present- Change in Wart/Mole, Dryness, Hives, Jaundice, New Lesions, Non-Healing Wounds, Rash and Ulcer. HEENT Present-  Hearing Loss and Wears glasses/contact lenses. Not Present- Earache, Hoarseness, Nose Bleed, Oral Ulcers, Ringing in the Ears, Seasonal Allergies, Sinus Pain, Sore Throat, Visual Disturbances and Yellow Eyes. Breast Not Present- Breast Mass, Breast Pain, Nipple Discharge and Skin Changes. Cardiovascular Not Present- Chest Pain, Difficulty Breathing Lying Down, Leg Cramps, Palpitations, Rapid Heart Rate, Shortness of Breath and Swelling of Extremities. Gastrointestinal Not Present- Abdominal Pain, Bloating, Bloody Stool, Change in Bowel Habits, Chronic diarrhea, Constipation, Difficulty Swallowing, Excessive gas, Gets full quickly at meals, Hemorrhoids, Indigestion, Nausea, Rectal Pain and Vomiting. Male Genitourinary Not Present- Blood in Urine, Change in Urinary Stream, Frequency, Impotence, Nocturia, Painful Urination, Urgency and Urine Leakage. Musculoskeletal Present- Joint Pain. Not Present- Back Pain, Joint Stiffness, Muscle Pain, Muscle Weakness and Swelling of Extremities. Neurological Not Present- Decreased Memory, Fainting, Headaches, Numbness, Seizures, Tingling, Tremor, Trouble walking and Weakness. Psychiatric Not Present- Anxiety, Bipolar, Change in Sleep Pattern, Depression, Fearful and Frequent crying. Endocrine Not Present- Cold Intolerance, Excessive Hunger, Hair Changes, Heat Intolerance, Hot flashes and New Diabetes. Hematology Not Present- Easy Bruising, Excessive bleeding, Gland problems, HIV and Persistent Infections.  Vitals Dylan Figueroa CMA; 09/30/2014 9:49 AM) 09/30/2014 9:49 AM Weight: 171 lb Height: 68in Body Surface Area: 1.93 m Body Mass Index: 26 kg/m Temp.: 98.38F(Oral)  Pulse: 89 (Regular)  BP: 130/72 (Sitting, Left Arm, Standard)     Physical Exam [name] [name] MD; 09/30/2014 2:25 PM)  The physical exam findings are as follows: Note:WDWN in NAD HEENT: EOMI, sclera anicteric Neck: No masses, no thyromegaly Left shoulder - 3 cm subcutaneous  mass; soft; well-demarcated; no overlying erythema or induration Lungs: CTA bilaterally; normal respiratory effort CV: Regular rate and rhythm; no murmurs  Abd: +bowel sounds, soft, non-tender, no masses Ext: Well-perfused; no edema Skin: Warm, dry; no sign of jaundice    Assessment & Plan [name] K. Millee [name]; 09/30/2014 10:58 AM)  MASS OF SKIN OF LEFT SHOULDER (782.2  R22.32)  Current Plans Schedule for Surgery - Excision of left shoulder mass - 3 cm. The surgical procedure has been discussed with the patient. Potential risks, benefits, alternative treatments, and expected outcomes have been explained. All of the patient's questions at this time have been answered. The likelihood of reaching the patient's treatment goal is good. The patient understand the proposed surgical procedure and wishes to proceed.  Dylan Figueroa. Dylan Dover, MD, [facility]  General/ Trauma Surgery  09/30/2014 2:25 PM

## 2014-10-13 NOTE — Anesthesia Postprocedure Evaluation (Signed)
Anesthesia Post Note  Patient: Dylan Figueroa  Procedure(s) Performed: Procedure(s) (LRB): EXCISION OF LEFT SHOULDER MASS (Left)  Anesthesia type: general  Patient location: PACU  Post pain: Pain level controlled  Post assessment: Patient's Cardiovascular Status Stable  Last Vitals:  Filed Vitals:   10/13/14 1006  BP: 123/78  Pulse: 72  Temp: 36.4 C  Resp: 18    Post vital signs: Reviewed and stable  Level of consciousness: sedated  Complications: No apparent anesthesia complications

## 2014-10-13 NOTE — Transfer of Care (Signed)
Immediate Anesthesia Transfer of Care Note  Patient: Nature A Ishaq  Procedure(s) Performed: Procedure(s): EXCISION OF LEFT SHOULDER MASS (Left)  Patient Location: PACU  Anesthesia Type:General  Level of Consciousness: sedated and patient cooperative  Airway & Oxygen Therapy: Patient Spontanous Breathing and Patient connected to face mask oxygen  Post-op Assessment: Report given to RN and Post -op Vital signs reviewed and stable  Post vital signs: Reviewed and stable  Last Vitals:  Filed Vitals:   10/13/14 0528  BP: 114/76  Pulse: 80  Temp: 36.4 C  Resp: 18    Complications: No apparent anesthesia complications

## 2014-10-13 NOTE — Op Note (Signed)
Pre-op Diagnosis:  Left shoulder mass - 3 cm subcutaneous Post-op Diagnosis:  Same Procedure: Excision of left shoulder mass Surgeon:  Goro Wenrick K. Anesthesia:  Gen - LMA Indications:  54 yo male presents with tender enlarging mass on left shoulder.  This is probably a lipoma.  He presents now for excision.  Description of procedure:  The patient was brought to the operating room and placed in a supine position on the OR table.  After an adequate level of anesthesia was obtained, a bump was placed behind his left shoulder. This area was prepped with ChloraPrep and draped sterile fashion. A timeout was taken to ensure the proper patient and proper procedure. We infiltrated the area over the mass with 0.25% Marcaine with epinephrine. A longitudinal incision was made. Dissection was carried down into the subcutaneous tissues with cautery. We dissected down to the surface of the mass. We bluntly dissected this off of the underlying muscle. This appeared to be a lipoma. We removed it entirely along with some of the overlying subcutaneous fat. This was sent for pathologic examination. The wound was inspected for hemostasis. We irrigated thoroughly. We closed the wound with 3-0 Vicryl and a subcuticular layer of 4-0 Monocryl. Steri-Strips and clean dressings were applied. The patient was then extubated and brought to recovery room in stable condition. All sponge, instrument, and needle counts are correct.  Imogene Burn. [name], MD, [facility]  General/ Trauma Surgery  10/13/2014 8:14 AM

## 2014-10-13 NOTE — Interval H&P Note (Signed)
History and Physical Interval Note:  10/13/2014 7:24 AM  Dylan Figueroa  has presented today for surgery, with the diagnosis of Left Shoulder Mass  The various methods of treatment have been discussed with the patient and family. After consideration of risks, benefits and other options for treatment, the patient has consented to  Procedure(s): EXCISION OF LEFT SHOULDER MASS (Left) as a surgical intervention .  The patient's history has been reviewed, patient examined, no change in status, stable for surgery.  I have reviewed the patient's chart and labs.  Questions were answered to the patient's satisfaction.     Nihal Marzella K.

## 2014-10-13 NOTE — Anesthesia Preprocedure Evaluation (Signed)
Anesthesia Evaluation  Patient identified by MRN, date of birth, ID band Patient awake    Reviewed: Allergy & Precautions, NPO status , Patient's Chart, lab work & pertinent test results  Airway Mallampati: I  TM Distance: >3 FB Neck ROM: Full    Dental   Pulmonary Current Smoker,    Pulmonary exam normal        Cardiovascular Normal cardiovascular exam     Neuro/Psych    GI/Hepatic   Endo/Other    Renal/GU      Musculoskeletal   Abdominal   Peds  Hematology   Anesthesia Other Findings   Reproductive/Obstetrics                             Anesthesia Physical Anesthesia Plan  ASA: II  Anesthesia Plan: General   Post-op Pain Management:    Induction: Intravenous  Airway Management Planned: LMA  Additional Equipment:   Intra-op Plan:   Post-operative Plan: Extubation in OR  Informed Consent: I have reviewed the patients History and Physical, chart, labs and discussed the procedure including the risks, benefits and alternatives for the proposed anesthesia with the patient or authorized representative who has indicated his/her understanding and acceptance.     Plan Discussed with: CRNA and Surgeon  Anesthesia Plan Comments:         Anesthesia Quick Evaluation  

## 2014-10-14 ENCOUNTER — Encounter (HOSPITAL_COMMUNITY): Payer: Self-pay | Admitting: Surgery

## 2015-11-02 ENCOUNTER — Other Ambulatory Visit: Payer: Self-pay | Admitting: Family Medicine

## 2016-03-30 IMAGING — CR DG SHOULDER 2+V*L*
3 series · 3 of 3 positions shown · non-contrast
Comparison: Chest radiographs from [facility] 03/20/2012

CLINICAL DATA: 53-year-old male with left shoulder pain for several
weeks with no known injury. Painful palpable abnormality along the
superior aspect of the shoulder discovered this morning. Initial
encounter.

EXAM:
LEFT SHOULDER - 2+ VIEW

[shoulder grashey]
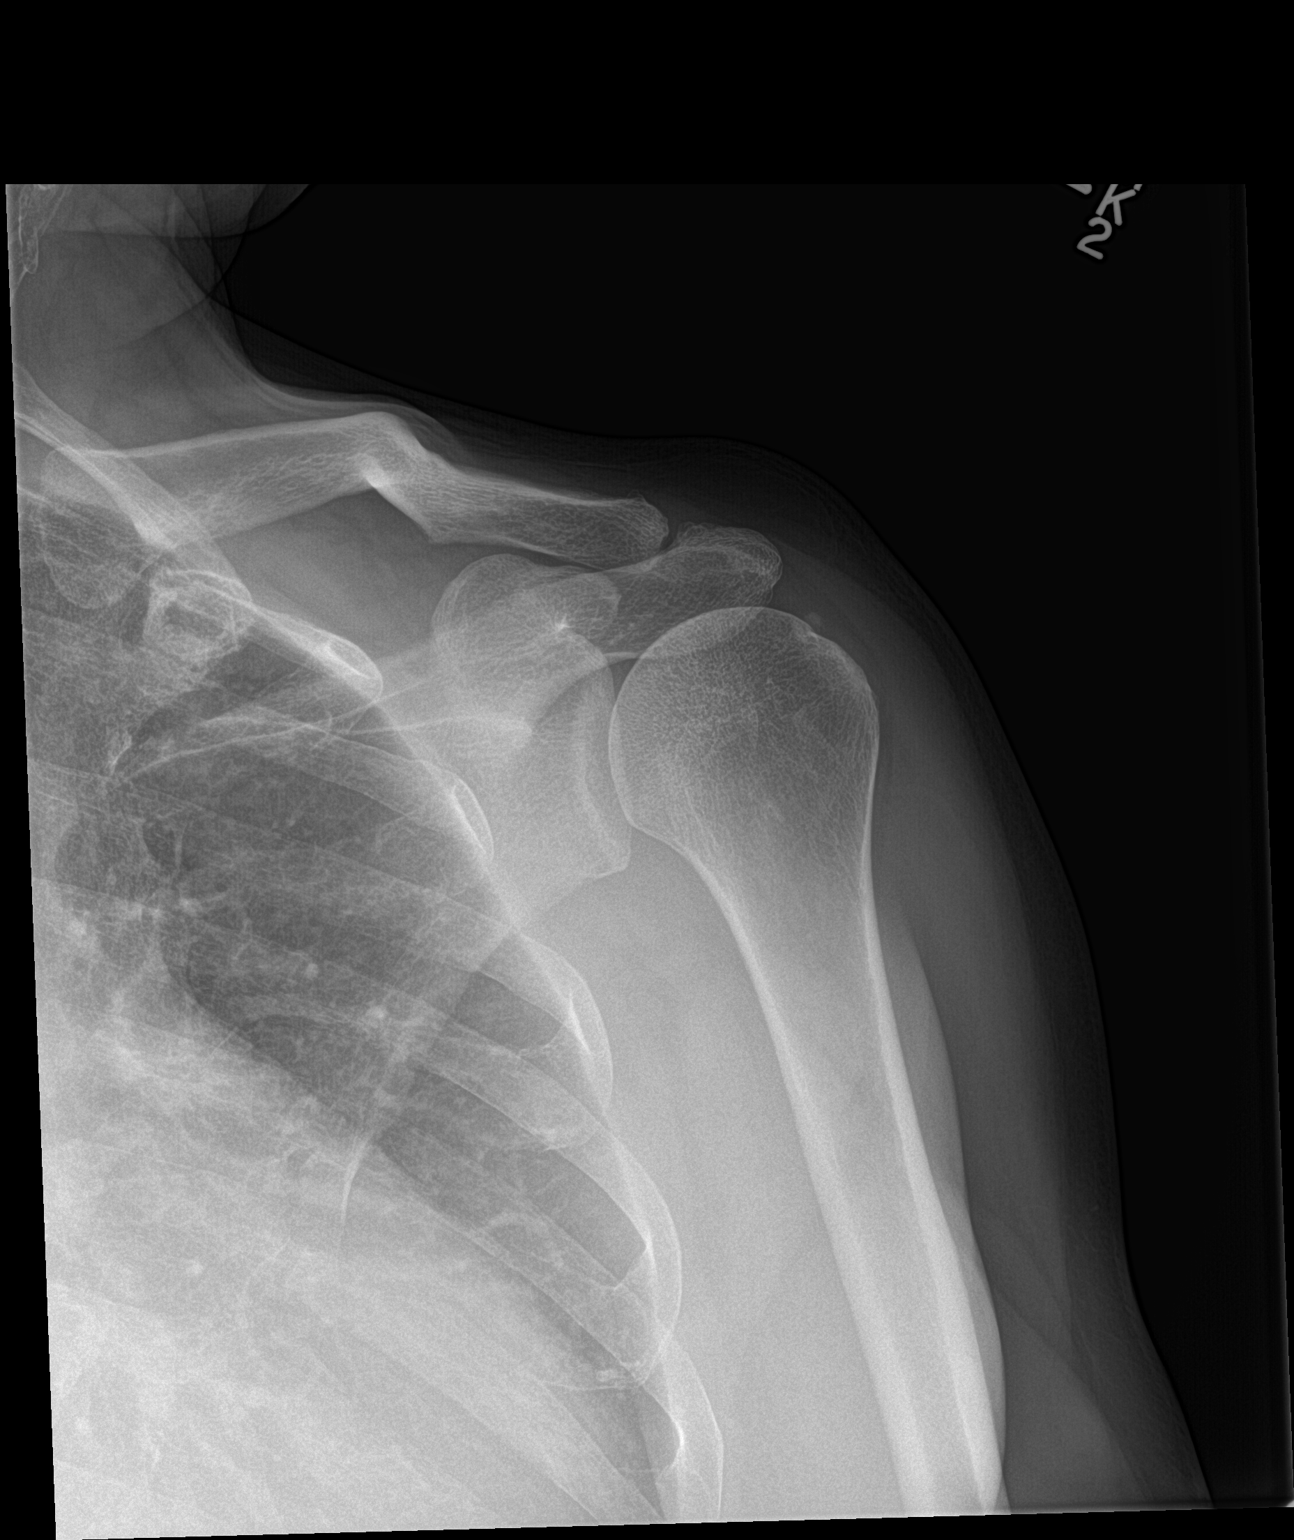

[shoulder y view]
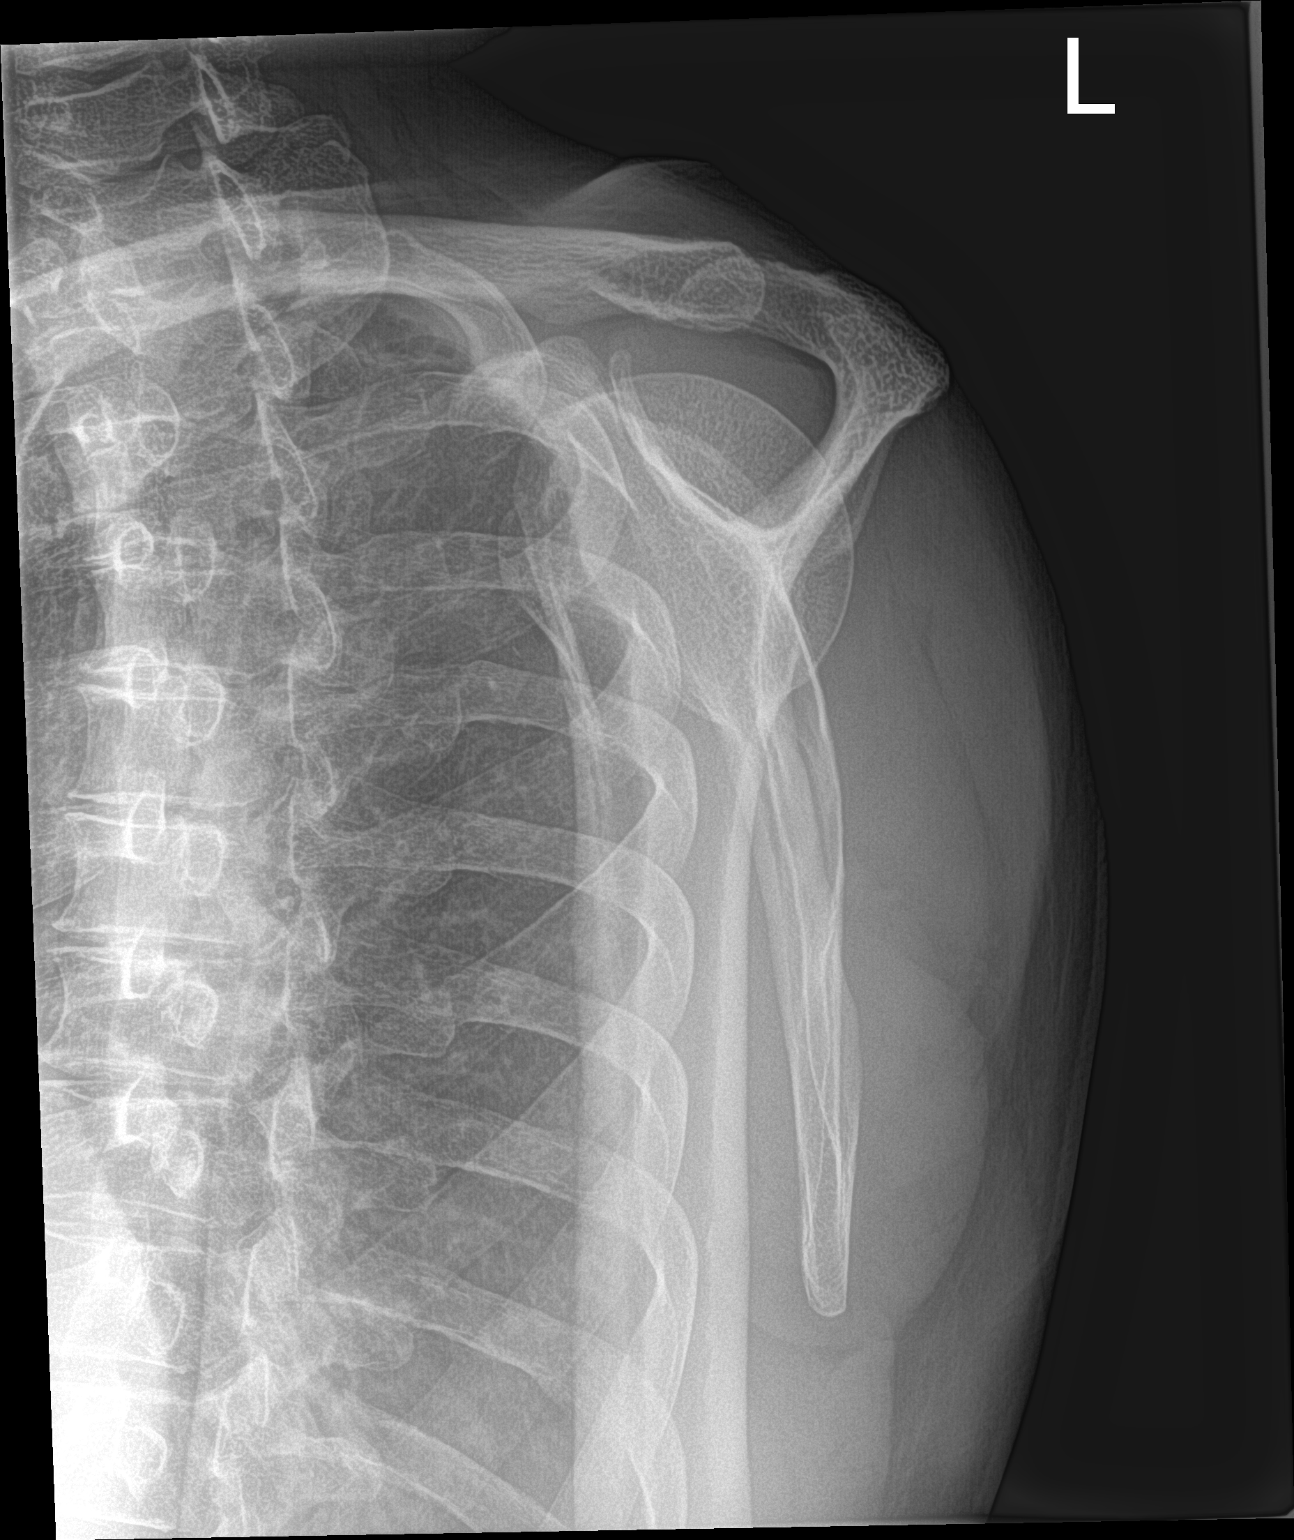

[shoulder axillary]
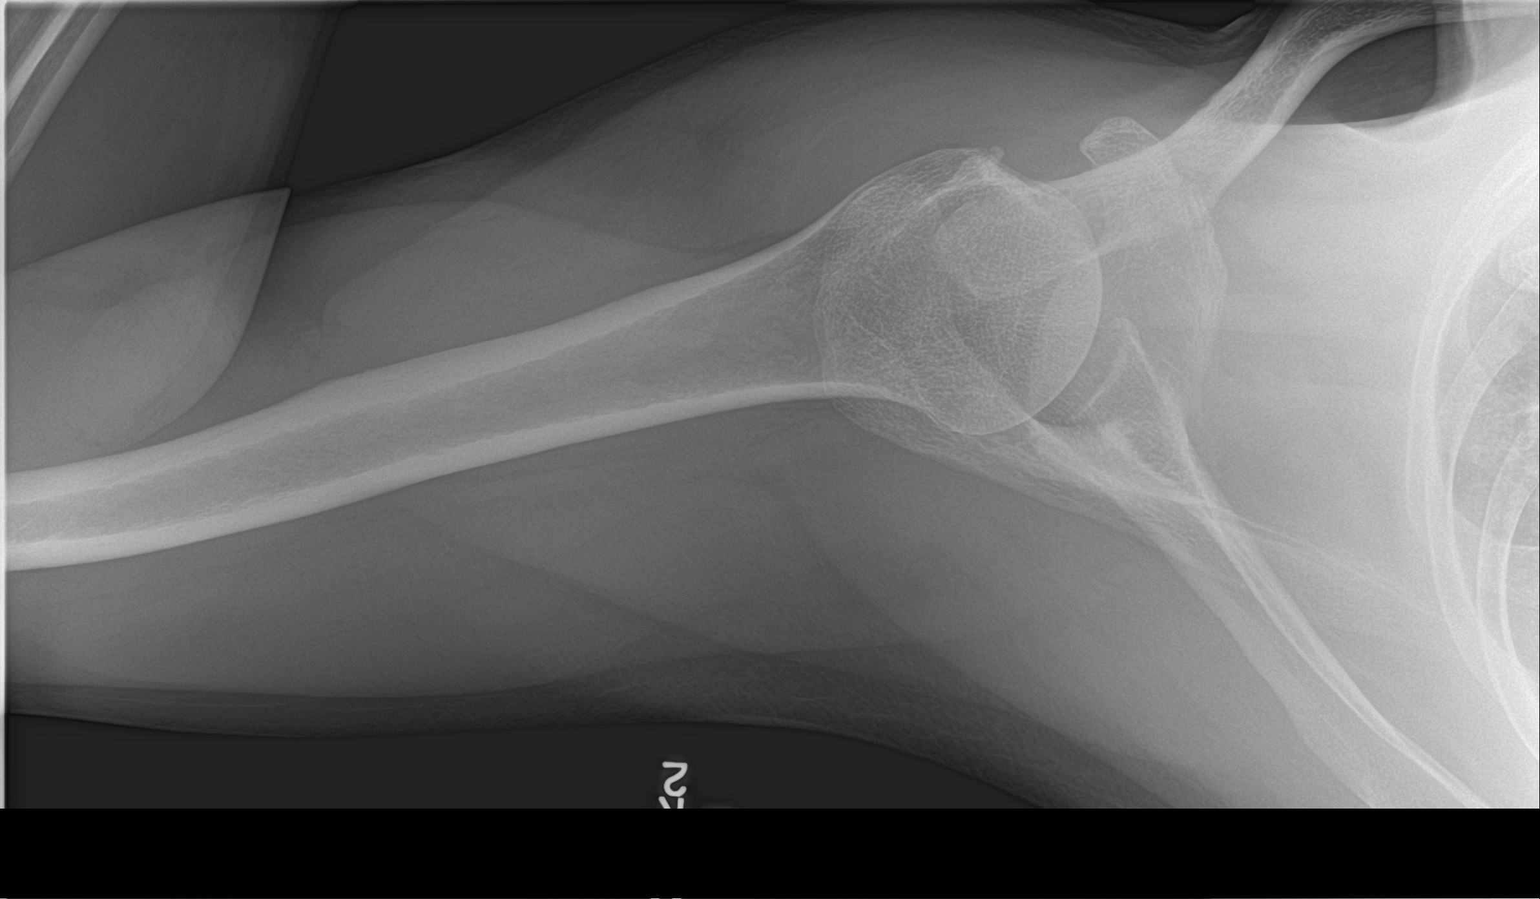

[3 of 3 positions shown; findings below may reference images not displayed]

FINDINGS: No glenohumeral joint dislocation. Proximal left humerus intact.
Small calcified density near the rotator cuff insertion likely is
degenerative. No left clavicle or scapula fracture identified.
Visible left ribs and lung parenchyma within normal limits. No
discrete soft tissue lesion is evident.
IMPRESSION: No acute osseous abnormality identified about the left shoulder.

## 2016-09-19 ENCOUNTER — Ambulatory Visit
Admission: RE | Admit: 2016-09-19 | Discharge: 2016-09-19 | Disposition: A | Discharge status: Home / Self Care | Payer: Managed Care, Other (non HMO) | Source: Ambulatory Visit | Attending: Family Medicine | Admitting: Family Medicine

## 2016-09-19 ENCOUNTER — Other Ambulatory Visit: Payer: Self-pay | Admitting: Family Medicine

## 2016-09-19 DIAGNOSIS — R52 Pain, unspecified: Secondary | ICD-10-CM

## 2017-09-11 ENCOUNTER — Other Ambulatory Visit: Payer: Self-pay | Admitting: Family Medicine

## 2017-09-11 ENCOUNTER — Ambulatory Visit
Admission: RE | Admit: 2017-09-11 | Discharge: 2017-09-11 | Disposition: A | Discharge status: Home / Self Care | Payer: Managed Care, Other (non HMO) | Source: Ambulatory Visit | Attending: Family Medicine | Admitting: Family Medicine

## 2017-09-11 DIAGNOSIS — R52 Pain, unspecified: Secondary | ICD-10-CM

## 2018-05-30 IMAGING — CR DG HAND COMPLETE 3+V*L*
3 series · 3 of 3 positions shown · non-contrast
Comparison: None.

CLINICAL DATA: Pain and stiffness for 3 weeks

EXAM:
LEFT HAND - COMPLETE 3+ VIEW

[x hand pa left]
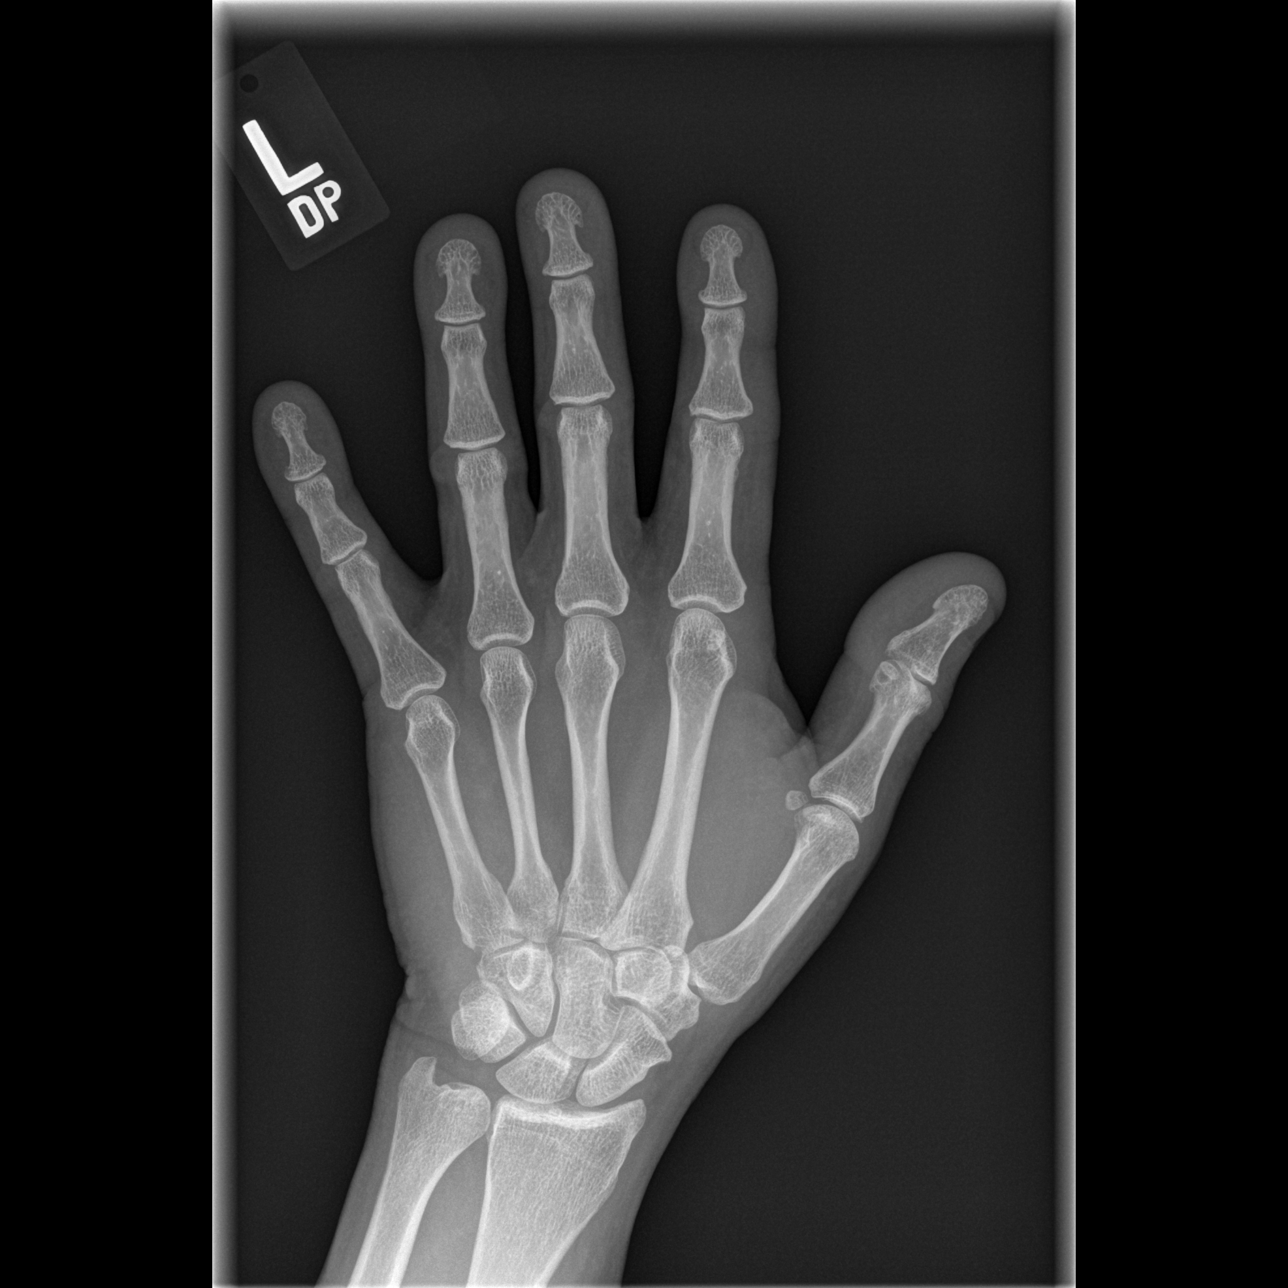

[x hand oblique left]
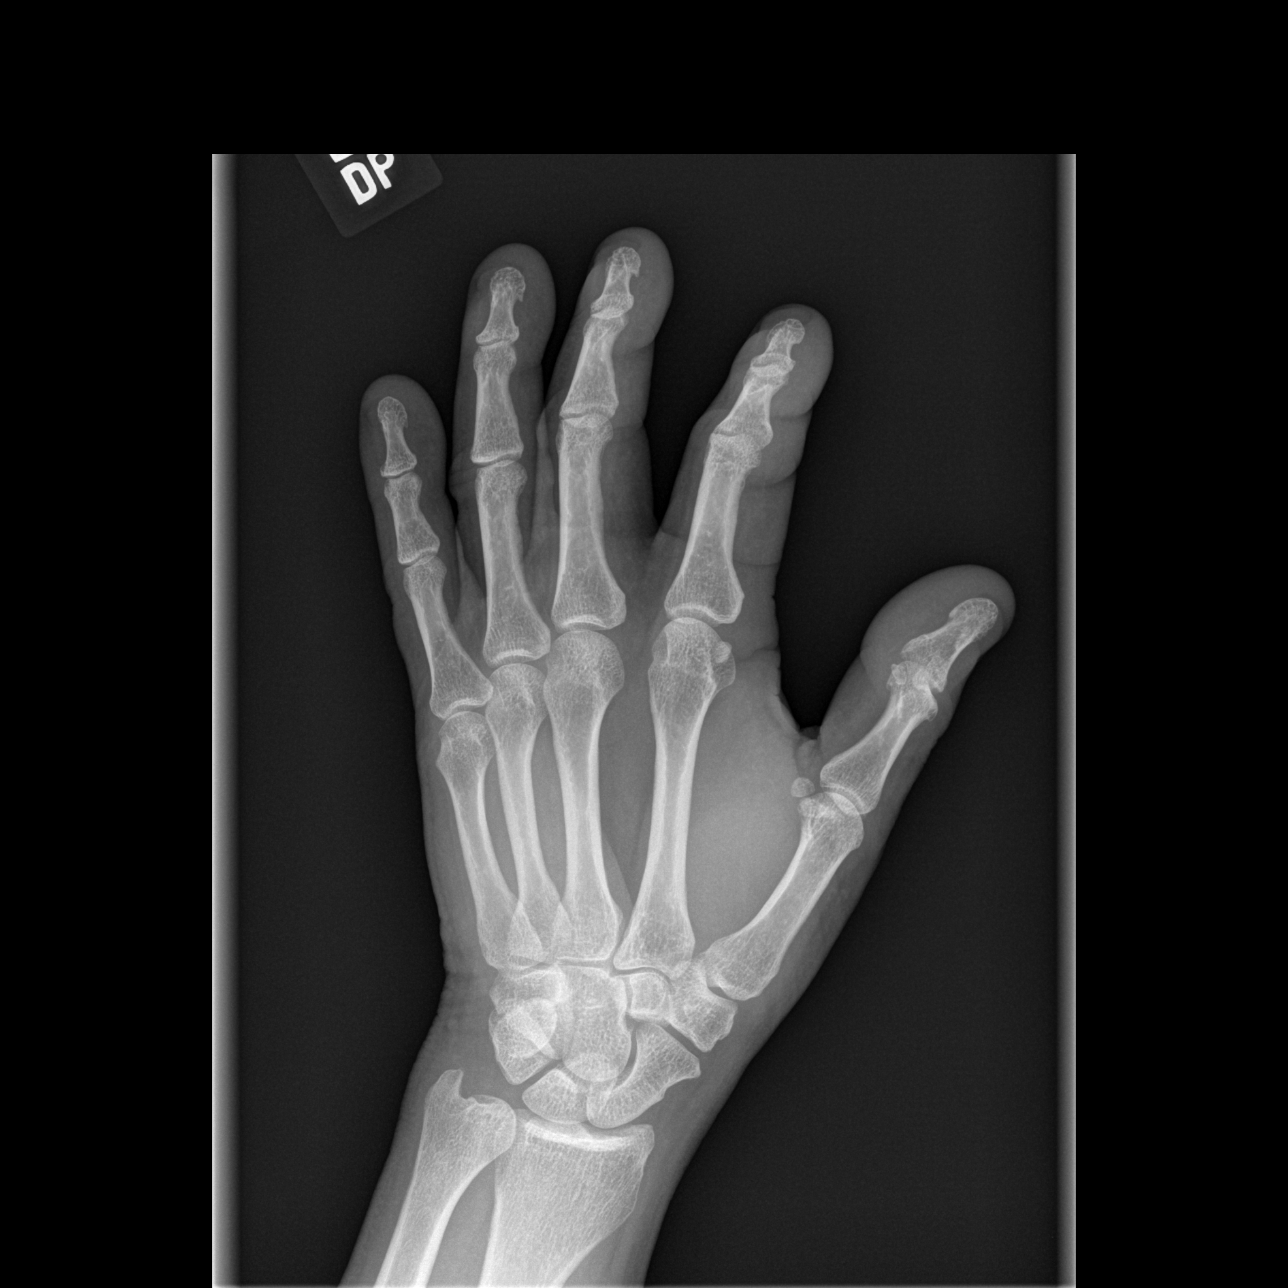

[x hand lat left]
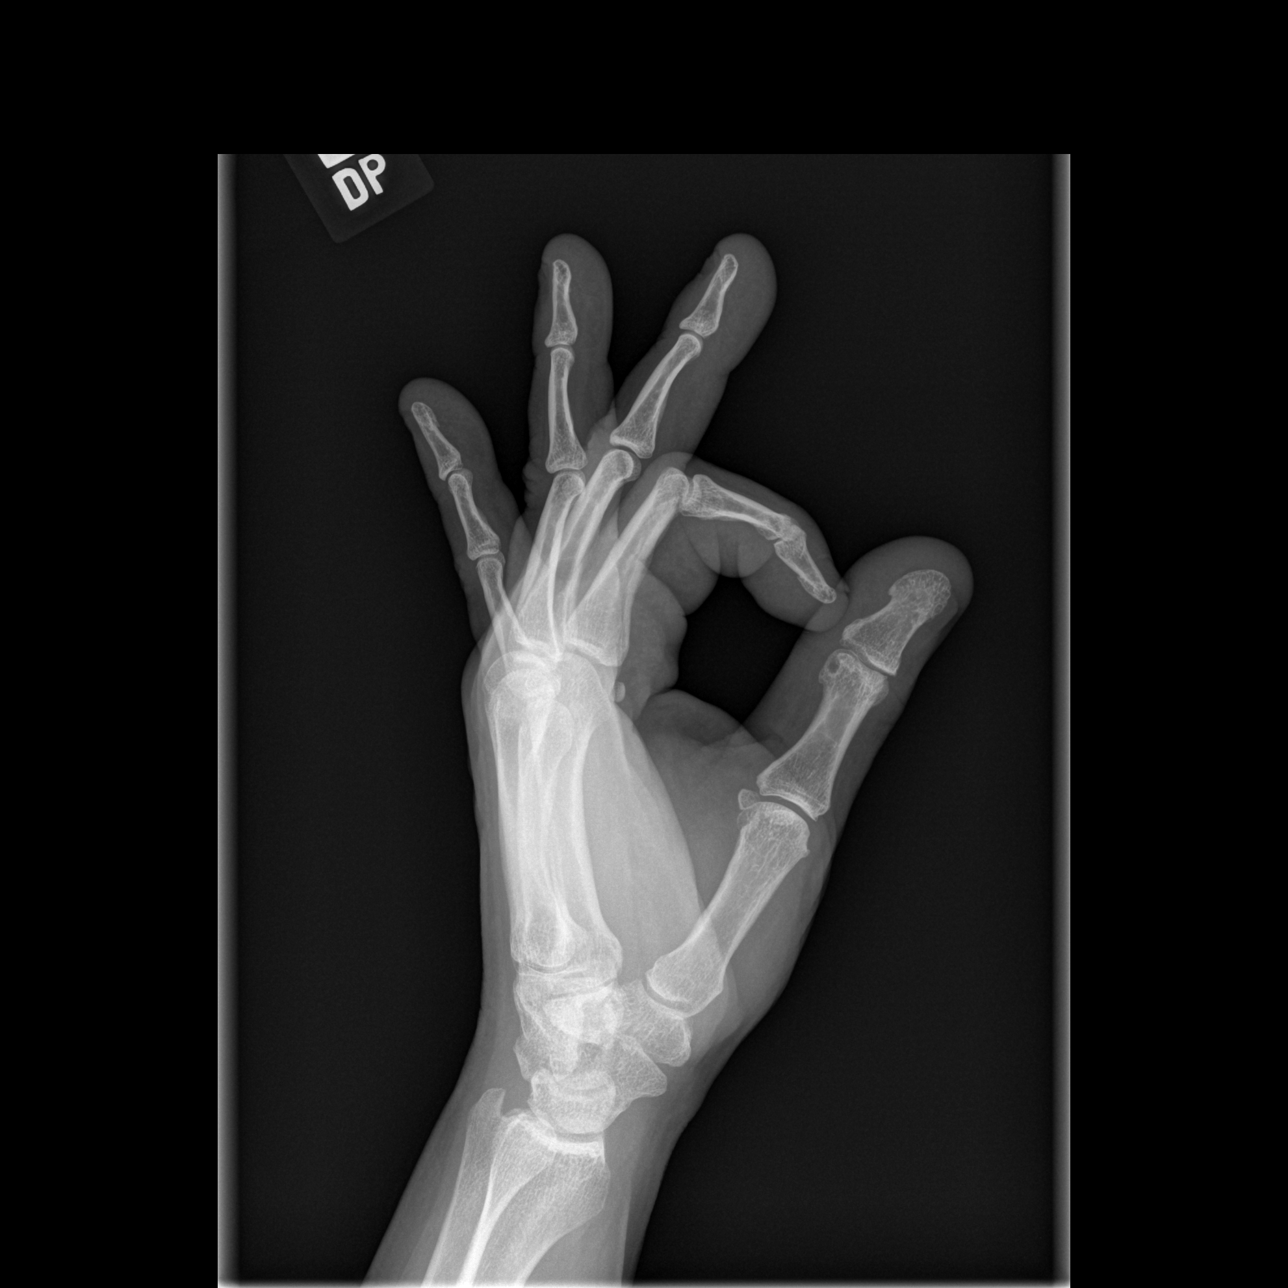

[3 of 3 positions shown; findings below may reference images not displayed]

FINDINGS: Frontal, oblique, and lateral views were obtained. There is no
fracture or dislocation. Joint spaces appear normal. No erosive
change. There is a small benign cyst in the medial aspect of the
distal portion of the first proximal phalanx.
IMPRESSION: No fracture or dislocation.  No appreciable arthropathy.

## 2018-07-06 MED FILL — VENLAFAXINE HCL ER 150 MG C: 150 | 30 days supply | Qty: 30 | Fill #0

## 2018-07-06 MED FILL — GABAPENTIN 300 MG CAPSULE: 300 | 30 days supply | Qty: 60 | Fill #0

## 2019-03-18 ENCOUNTER — Other Ambulatory Visit: Payer: Self-pay | Admitting: Family Medicine

## 2019-03-18 ENCOUNTER — Ambulatory Visit
Admission: RE | Admit: 2019-03-18 | Discharge: 2019-03-18 | Disposition: A | Discharge status: Home / Self Care | Payer: Managed Care, Other (non HMO) | Source: Ambulatory Visit | Attending: Family Medicine | Admitting: Family Medicine

## 2019-03-18 DIAGNOSIS — R0602 Shortness of breath: Secondary | ICD-10-CM
